# Patient Record
Sex: Female | Born: 1980 | Race: Asian | Hispanic: No | Marital: Married | Smoking: Never smoker
Health system: Southern US, Community
[De-identification: ages and names within clinical notes are randomized; demographics above are authoritative.]

## PROBLEM LIST (undated history)

## (undated) DIAGNOSIS — F329 Major depressive disorder, single episode, unspecified: Secondary | ICD-10-CM

## (undated) DIAGNOSIS — K219 Gastro-esophageal reflux disease without esophagitis: Secondary | ICD-10-CM

## (undated) DIAGNOSIS — F32A Depression, unspecified: Secondary | ICD-10-CM

## (undated) DIAGNOSIS — R51 Headache: Secondary | ICD-10-CM

---

## 2004-05-06 ENCOUNTER — Emergency Department (HOSPITAL_COMMUNITY): Admission: EM | Admit: 2004-05-06 | Discharge: 2004-05-06 | Payer: Self-pay | Admitting: Family Medicine

## 2004-12-24 ENCOUNTER — Emergency Department (HOSPITAL_COMMUNITY): Admission: EM | Admit: 2004-12-24 | Discharge: 2004-12-24 | Payer: Self-pay | Admitting: Emergency Medicine

## 2007-06-19 ENCOUNTER — Other Ambulatory Visit: Admission: RE | Admit: 2007-06-19 | Discharge: 2007-06-19 | Payer: Self-pay | Admitting: Obstetrics and Gynecology

## 2008-09-02 ENCOUNTER — Ambulatory Visit (HOSPITAL_COMMUNITY): Admission: RE | Admit: 2008-09-02 | Discharge: 2008-09-02 | Payer: Self-pay | Admitting: Family Medicine

## 2008-09-14 ENCOUNTER — Encounter (HOSPITAL_COMMUNITY): Admission: RE | Admit: 2008-09-14 | Discharge: 2008-10-14 | Payer: Self-pay | Admitting: Family Medicine

## 2008-10-06 ENCOUNTER — Encounter (INDEPENDENT_AMBULATORY_CARE_PROVIDER_SITE_OTHER): Payer: Self-pay | Admitting: *Deleted

## 2008-11-22 ENCOUNTER — Ambulatory Visit: Payer: Self-pay | Admitting: Gastroenterology

## 2008-11-22 DIAGNOSIS — R1012 Left upper quadrant pain: Secondary | ICD-10-CM

## 2008-11-22 DIAGNOSIS — R110 Nausea: Secondary | ICD-10-CM

## 2008-11-23 ENCOUNTER — Ambulatory Visit (HOSPITAL_COMMUNITY): Admission: RE | Admit: 2008-11-23 | Discharge: 2008-11-23 | Payer: Self-pay | Admitting: Gastroenterology

## 2008-11-23 ENCOUNTER — Encounter: Payer: Self-pay | Admitting: Gastroenterology

## 2008-11-23 ENCOUNTER — Ambulatory Visit: Payer: Self-pay | Admitting: Gastroenterology

## 2008-11-24 ENCOUNTER — Encounter: Payer: Self-pay | Admitting: Gastroenterology

## 2010-05-30 ENCOUNTER — Encounter: Payer: Self-pay | Admitting: Gastroenterology

## 2010-06-25 ENCOUNTER — Encounter: Payer: Self-pay | Admitting: Family Medicine

## 2010-07-05 NOTE — Letter (Signed)
Summary: REFERRAL FROM [facility] MEDICAL  REFERRAL FROM [facility] MEDICAL   Imported By: Rexene Alberts 05/30/2010 11:24:32  _____________________________________________________________________  External Attachment:    Type:   Image     Comment:   External Document

## 2010-09-10 LAB — PREGNANCY, URINE: Preg Test, Ur: NEGATIVE

## 2010-10-16 NOTE — Op Note (Signed)
NAMEKAMALEI, [name]              ACCOUNT NO.:  [id number]   MEDICAL RECORD NO.:  [id number]          PATIENT TYPE:  AMB   LOCATION:  DAY                           FACILITY:  [facility]   PHYSICIAN:  Kassie Mends, M.D.      DATE OF BIRTH:  [birth year]   DATE OF PROCEDURE:  11/23/2008  DATE OF DISCHARGE:                               OPERATIVE REPORT   PRIMARY Rhonda Riley:  Melony Overly, PA   PROCEDURE:  Esophagogastroduodenoscopy with cold forceps biopsy of the  gastric mucosa.   INDICATION FOR EXAM:  Ms. Rhonda Riley is a 30 year old female who presents  with nausea and left upper quadrant pain.  Her symptoms are usually  exacerbated by stress.  She states she has a lot of stress of work.  She  is currently on break from work and is having less upper symptoms.  Her  last menstrual period was on November 25, 2008.  She did have an urine hCG  today which was negative.  She denies any problems swallowing, vomiting,  or the use of aspirin, BC or Goody Powders, ibuprofen, Motrin, or Aleve.   FINDINGS:  1. Normal esophagus with slightly irregular Z-line.  Otherwise, no      evidence of Barrett's, mass, erosion, ulceration, or stricture.  2. Occasional erosion seen in the antrum with patchy erythema.  No      ulcers.  Biopsies obtained via cold forceps to evaluate for H.      pylori gastritis or eosinophilic gastritis.  3. Normal duodenal bulb and second portion of the duodenum.   DIAGNOSIS:  Left upper quadrant pain likely secondary to functional  abdominal pain.   RECOMMENDATIONS:  1. She should continue to see her psychiatrist and have her mental      health issues managed.  2. Follow up appointment in 2 months with Tana Coast regarding her      abdominal pain.  We will call her with the results of her biopsies.  3. No aspirin, NSAIDs, or anticoagulation for 7 days.   MEDICATIONS:  1. Demerol 50 mg IV.  2. Versed 3 mg IV.  3. Phenergan 12.5 mg IV.   PROCEDURE TECHNIQUE:  Physical exam was  performed.  Informed consent was  obtained from the patient after explaining the benefits, risks, and  alternatives to the procedure.  The patient was connected to the monitor  and placed in the left lateral position.  Continuous oxygen was provided  by nasal cannula, and IV medicine administered through an indwelling  cannula.  After administration of sedation, the patient's esophagus was  intubated, and the scope was advanced under direct visualization to the  second portion of the duodenum.  The scope was removed  slowly by carefully examining the color, texture, anatomy, and integrity  of the mucosa on the way out.  The patient was recovered in endoscopy  and discharged home in satisfactory condition.   PATH:  Chronic gastritis. Add omeprazole qd.      Kassie Mends, M.D.  Electronically Signed     SM/MEDQ  D:  11/23/2008  T:  11/24/2008  Job:  [id number]   cc:   Melony Overly, PA   Tilford Pillar, MD  Fax: [phone]

## 2011-01-18 IMAGING — US US ABDOMEN COMPLETE
1 series · 14 of 25 positions shown · non-contrast
Comparison: None

CLINICAL DATA: Abdominal pain with nausea.

COMPLETE ABDOMINAL ULTRASOUND

[Series 1: us abdomen complete · 0.32mm/px · 14 of 85 slices shown]
[im 1/85]
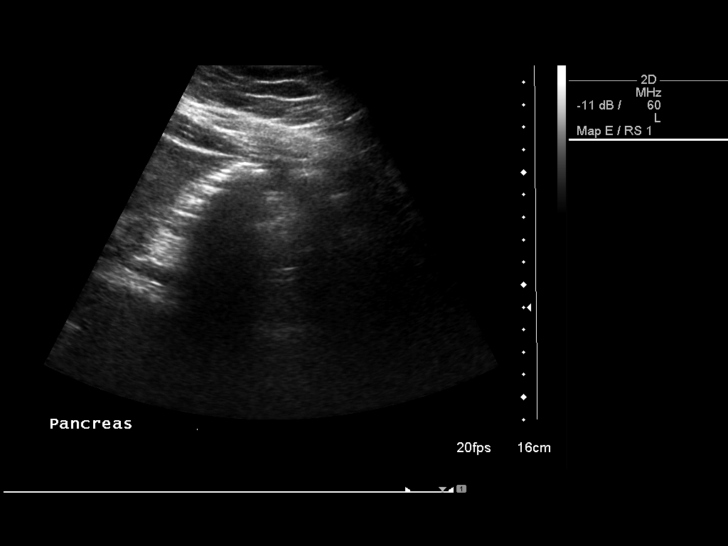
[im 8/85]
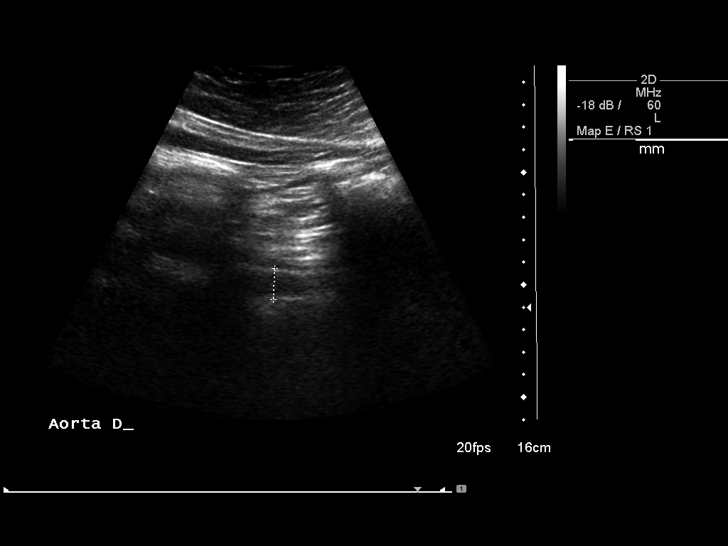
[im 15/85]
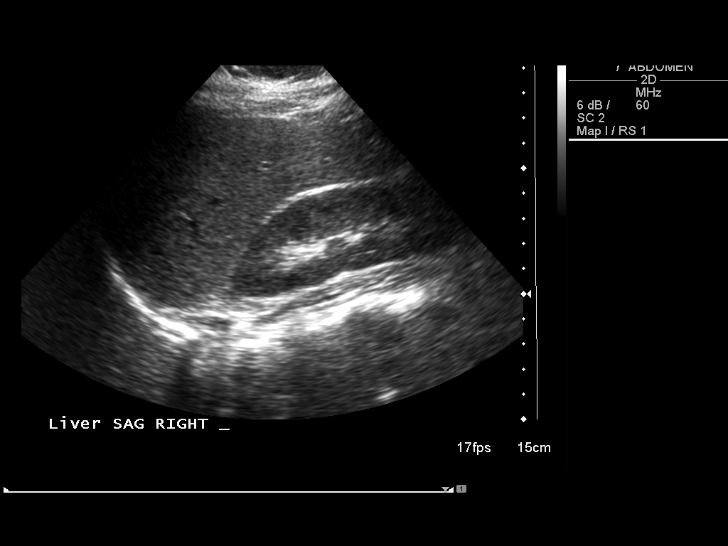
[im 22/85]
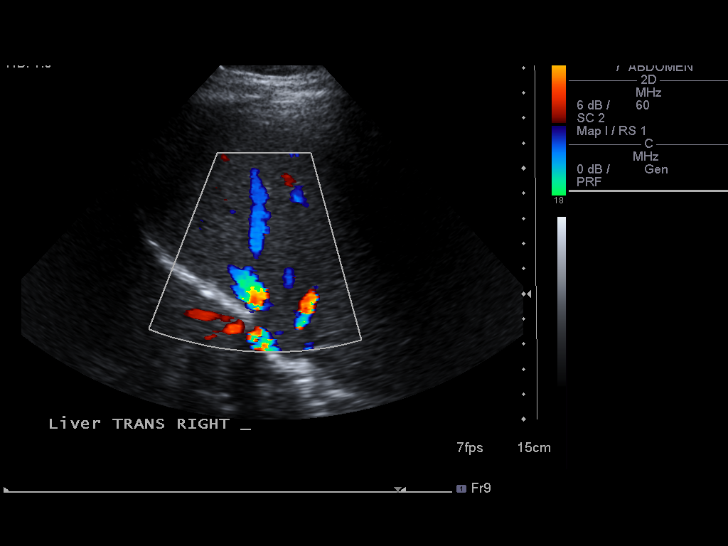
[im 29/85]
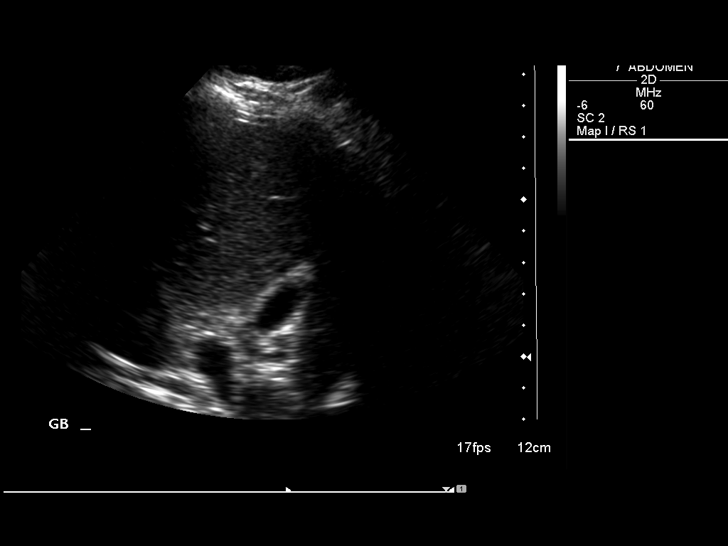
[im 32/85]
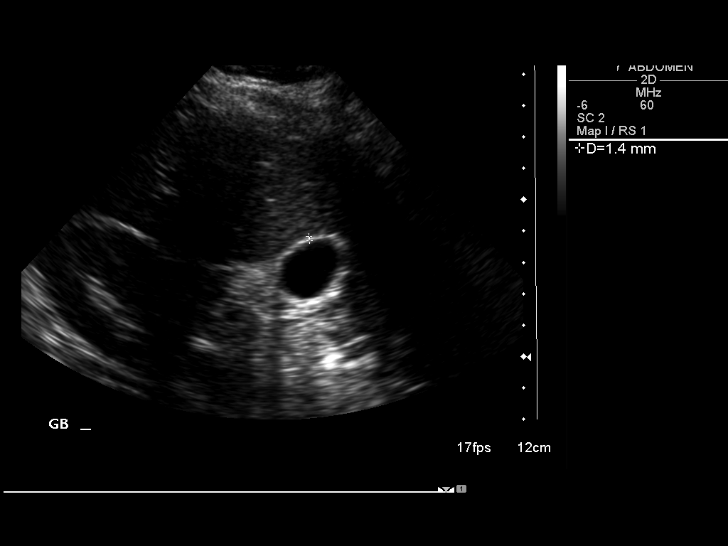
[im 39/85]
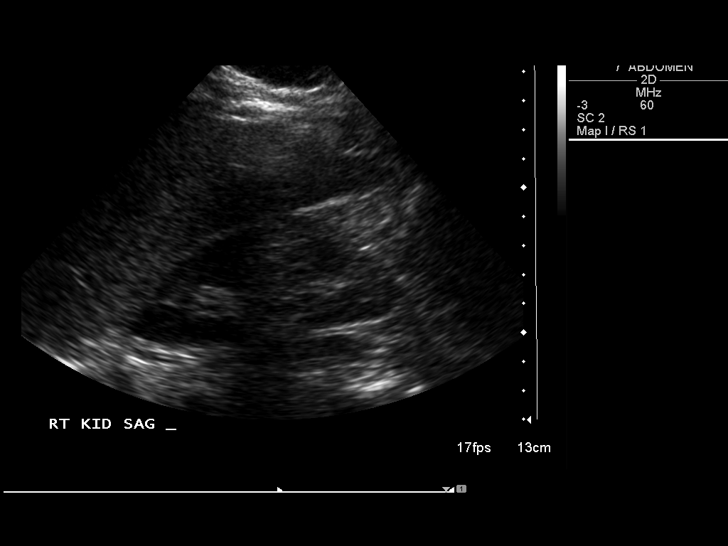
[im 46/85]
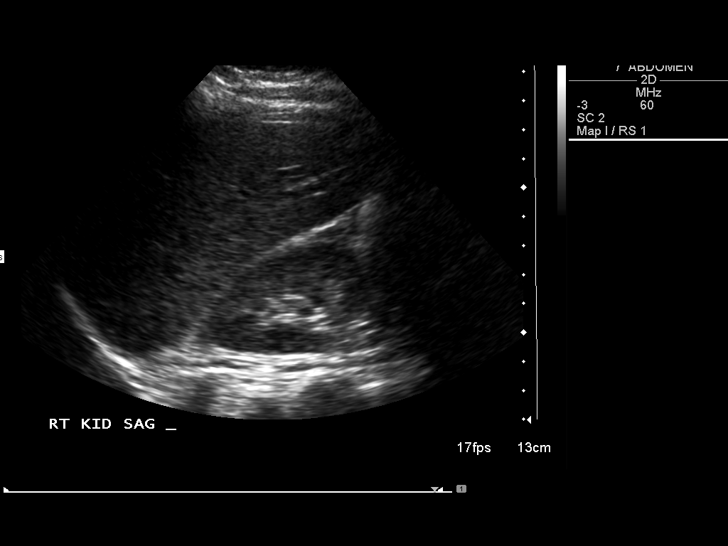
[im 53/85]
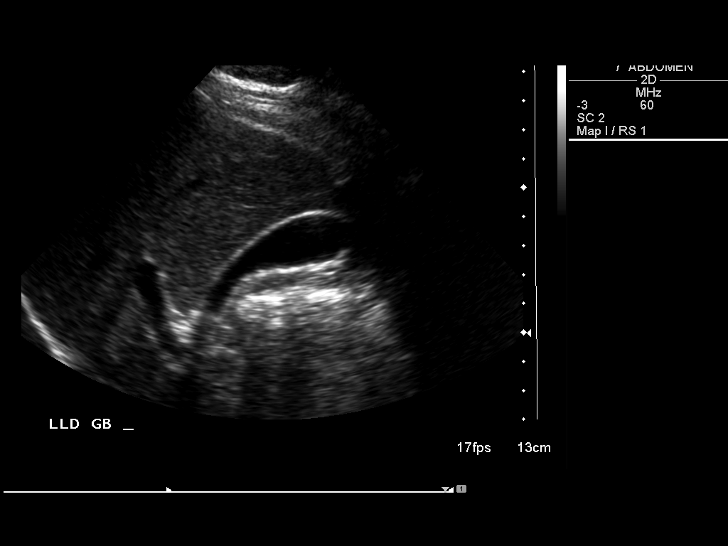
[im 57/85]
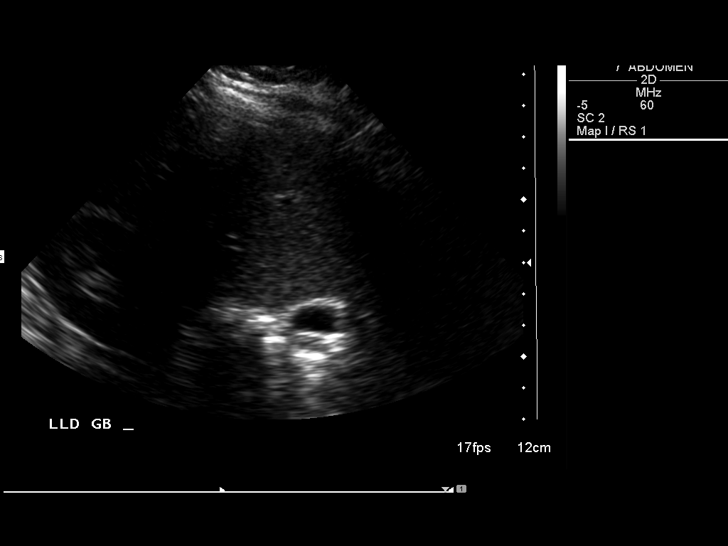
[im 64/85]
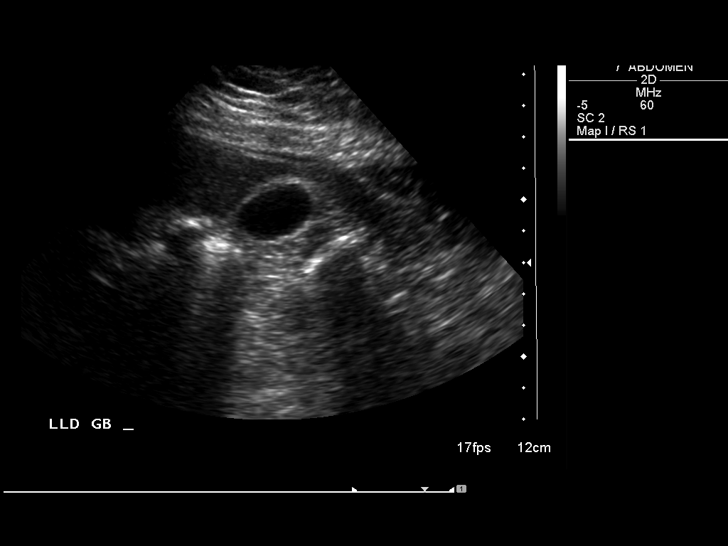
[im 71/85]
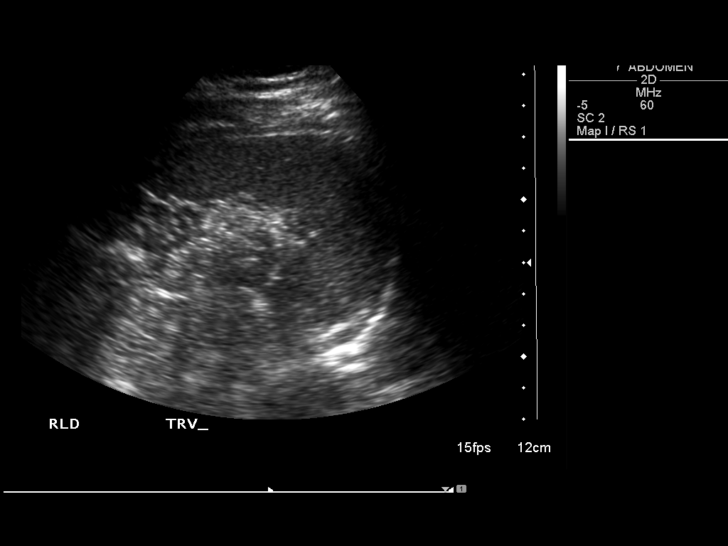
[im 78/85]
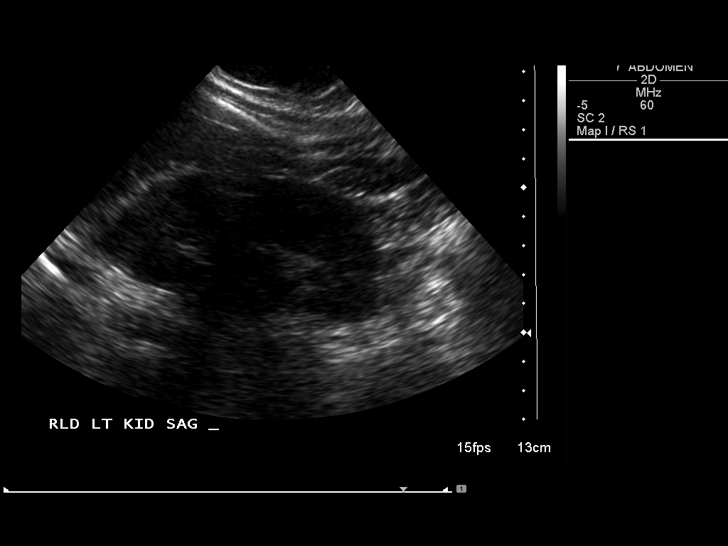
[im 85/85]
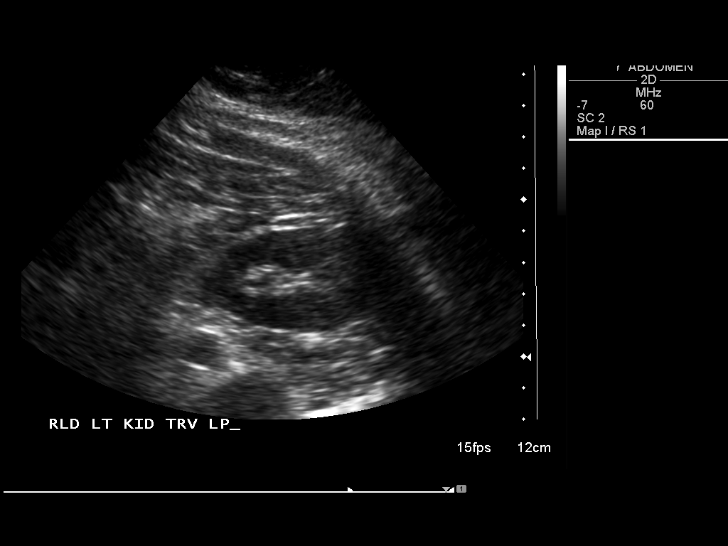

[14 of 25 positions shown; findings below may reference images not displayed]

FINDINGS: Gallbladder:  Normal - no evidence of cholelithiasis or
cholecystitis.

Common bile duct:  Normal - 2.5 mm.

Liver:  Normal

IVC:  Normal

Pancreas:  Not well visualized secondary to overlying bowel gas.

Spleen:  Normal

Right Kidney:  Normal

Left Kidney:  Normal

Abdominal aorta:  Unremarkable.

No evidence of free fluid identified.
IMPRESSION: Pancreas not well visualized otherwise unremarkable abdominal
ultrasound.

## 2011-10-23 LAB — OB RESULTS CONSOLE RPR: RPR: NONREACTIVE

## 2011-10-23 LAB — OB RESULTS CONSOLE ANTIBODY SCREEN: Antibody Screen: NEGATIVE

## 2011-10-23 LAB — OB RESULTS CONSOLE RUBELLA ANTIBODY, IGM: Rubella: UNDETERMINED

## 2011-10-23 LAB — OB RESULTS CONSOLE ABO/RH: RH Type: POSITIVE

## 2011-11-14 LAB — OB RESULTS CONSOLE GC/CHLAMYDIA: Chlamydia: NEGATIVE

## 2012-06-06 ENCOUNTER — Inpatient Hospital Stay (HOSPITAL_COMMUNITY)
Admission: AD | Admit: 2012-06-06 | Discharge: 2012-06-06 | Disposition: A | Discharge status: Home / Self Care | Payer: BC Managed Care – PPO | Source: Ambulatory Visit | Attending: Obstetrics and Gynecology | Admitting: Obstetrics and Gynecology

## 2012-06-06 ENCOUNTER — Encounter (HOSPITAL_COMMUNITY): Payer: Self-pay | Admitting: *Deleted

## 2012-06-06 DIAGNOSIS — O479 False labor, unspecified: Secondary | ICD-10-CM

## 2012-06-06 MED ORDER — ZOLPIDEM TARTRATE 5 MG PO TABS
5.0000 mg | ORAL_TABLET | Freq: Once | ORAL | Status: AC
  Administered 2012-06-06: 5 mg via ORAL
  Filled 2012-06-06: qty 1

## 2012-06-06 NOTE — MAU Note (Addendum)
Pt reports having ctxx q 5 min. Denies SROM reports some mucus and bloody show a nd reports good fetal movement.

## 2012-06-06 NOTE — ED Provider Notes (Signed)
This is a 32 y.o. female at [redacted]w[redacted]d G1P0 who presented for labor evaluation. She reports leaking fluid all day. No gross leakage, just small amounts.  FHR reactive UCs every 2-3 minutes  Speculum exam:  No pooling                               Small amount tan/pink mucous discharge  Cervix 1-2/85/-2/vtx/mid-posterior

## 2012-06-06 NOTE — MAU Note (Signed)
Notified Dr. Arelia Sneddon patient presented for r/o ROM and labor, negative fern slide per speculum exam, contractions every 3 to 4 minutes moderate to strong, cervix 1.5/80/-2 vertex with some bloody show, fhr reactive, patient to walk and be rechecked in one hour.

## 2012-06-06 NOTE — MAU Note (Signed)
Notified MD cervix unchanged from previous check, fhr reactive, received order to discharge home with Ambien prior to discharge.

## 2012-06-08 ENCOUNTER — Encounter (HOSPITAL_COMMUNITY): Payer: Self-pay | Admitting: *Deleted

## 2012-06-08 ENCOUNTER — Inpatient Hospital Stay (HOSPITAL_COMMUNITY)
Admission: AD | Admit: 2012-06-08 | Discharge: 2012-06-12 | DRG: 370 | Disposition: A | Discharge status: Home / Self Care | Payer: BC Managed Care – PPO | Source: Ambulatory Visit | Attending: Obstetrics and Gynecology | Admitting: Obstetrics and Gynecology

## 2012-06-08 DIAGNOSIS — O9903 Anemia complicating the puerperium: Secondary | ICD-10-CM | POA: Diagnosis not present

## 2012-06-08 DIAGNOSIS — R1012 Left upper quadrant pain: Secondary | ICD-10-CM

## 2012-06-08 DIAGNOSIS — O324XX0 Maternal care for high head at term, not applicable or unspecified: Secondary | ICD-10-CM | POA: Diagnosis present

## 2012-06-08 DIAGNOSIS — R110 Nausea: Secondary | ICD-10-CM

## 2012-06-08 DIAGNOSIS — D62 Acute posthemorrhagic anemia: Secondary | ICD-10-CM | POA: Diagnosis not present

## 2012-06-08 HISTORY — DX: Gastro-esophageal reflux disease without esophagitis: K21.9

## 2012-06-08 HISTORY — DX: Headache: R51

## 2012-06-08 HISTORY — DX: Major depressive disorder, single episode, unspecified: F32.9

## 2012-06-08 HISTORY — DX: Depression, unspecified: F32.A

## 2012-06-08 LAB — TYPE AND SCREEN: ABO/RH(D): O POS

## 2012-06-08 LAB — CBC
Platelets: 226 10*3/uL (ref 150–400)
RDW: 13.2 % (ref 11.5–15.5)
WBC: 10.1 10*3/uL (ref 4.0–10.5)

## 2012-06-08 LAB — ABO/RH: ABO/RH(D): O POS

## 2012-06-08 MED ORDER — BUTORPHANOL TARTRATE 1 MG/ML IJ SOLN
1.0000 mg | INTRAMUSCULAR | Status: DC | PRN
  Administered 2012-06-08: 1 mg via INTRAVENOUS
  Filled 2012-06-08: qty 1

## 2012-06-08 MED ORDER — TERBUTALINE SULFATE 1 MG/ML IJ SOLN: 0.2500 mg | Freq: Once | INTRAMUSCULAR | Status: AC | PRN

## 2012-06-08 MED ORDER — EPHEDRINE 5 MG/ML INJ: 10.0000 mg | INTRAVENOUS | Status: DC | PRN

## 2012-06-08 MED ORDER — ACETAMINOPHEN 325 MG PO TABS: 650.0000 mg | ORAL_TABLET | ORAL | Status: DC | PRN

## 2012-06-08 MED ORDER — OXYCODONE-ACETAMINOPHEN 5-325 MG PO TABS: 1.0000 | ORAL_TABLET | ORAL | Status: DC | PRN

## 2012-06-08 MED ORDER — OXYTOCIN 40 UNITS IN LACTATED RINGERS INFUSION - SIMPLE MED: 62.5000 mL/h | INTRAVENOUS | Status: DC

## 2012-06-08 MED ORDER — PHENYLEPHRINE 40 MCG/ML (10ML) SYRINGE FOR IV PUSH (FOR BLOOD PRESSURE SUPPORT)
80.0000 ug | PREFILLED_SYRINGE | INTRAVENOUS | Status: DC | PRN
  Filled 2012-06-08: qty 5

## 2012-06-08 MED ORDER — DIPHENHYDRAMINE HCL 50 MG/ML IJ SOLN: 12.5000 mg | INTRAMUSCULAR | Status: DC | PRN

## 2012-06-08 MED ORDER — LACTATED RINGERS IV SOLN
500.0000 mL | INTRAVENOUS | Status: DC | PRN
  Administered 2012-06-08: 300 mL via INTRAVENOUS

## 2012-06-08 MED ORDER — EPHEDRINE 5 MG/ML INJ
10.0000 mg | INTRAVENOUS | Status: DC | PRN
  Filled 2012-06-08: qty 4

## 2012-06-08 MED ORDER — LIDOCAINE HCL (PF) 1 % IJ SOLN: 30.0000 mL | INTRAMUSCULAR | Status: DC | PRN

## 2012-06-08 MED ORDER — LACTATED RINGERS IV SOLN: 500.0000 mL | Freq: Once | INTRAVENOUS | Status: DC

## 2012-06-08 MED ORDER — PHENYLEPHRINE 40 MCG/ML (10ML) SYRINGE FOR IV PUSH (FOR BLOOD PRESSURE SUPPORT): 80.0000 ug | PREFILLED_SYRINGE | INTRAVENOUS | Status: DC | PRN

## 2012-06-08 MED ORDER — LACTATED RINGERS IV SOLN
INTRAVENOUS | Status: DC
  Administered 2012-06-08 (×2): via INTRAVENOUS

## 2012-06-08 MED ORDER — OXYTOCIN 40 UNITS IN LACTATED RINGERS INFUSION - SIMPLE MED
1.0000 m[IU]/min | INTRAVENOUS | Status: DC
  Administered 2012-06-09: 1 m[IU]/min via INTRAVENOUS
  Filled 2012-06-08: qty 1000

## 2012-06-08 MED ORDER — OXYTOCIN BOLUS FROM INFUSION: 500.0000 mL | INTRAVENOUS | Status: DC

## 2012-06-08 MED ORDER — CITRIC ACID-SODIUM CITRATE 334-500 MG/5ML PO SOLN
30.0000 mL | ORAL | Status: DC | PRN
  Administered 2012-06-09: 30 mL via ORAL
  Filled 2012-06-08: qty 15

## 2012-06-08 MED ORDER — FLEET ENEMA 7-19 GM/118ML RE ENEM
1.0000 | ENEMA | RECTAL | Status: DC | PRN
  Administered 2012-06-08: 1 via RECTAL

## 2012-06-08 MED ORDER — FENTANYL 2.5 MCG/ML BUPIVACAINE 1/10 % EPIDURAL INFUSION (WH - ANES)
14.0000 mL/h | INTRAMUSCULAR | Status: DC
  Filled 2012-06-08: qty 125

## 2012-06-08 MED ORDER — ONDANSETRON HCL 4 MG/2ML IJ SOLN: 4.0000 mg | Freq: Four times a day (QID) | INTRAMUSCULAR | Status: DC | PRN

## 2012-06-08 MED ORDER — IBUPROFEN 600 MG PO TABS: 600.0000 mg | ORAL_TABLET | Freq: Four times a day (QID) | ORAL | Status: DC | PRN

## 2012-06-08 NOTE — H&P (Signed)
Rhonda Riley is a 32 y.o. female presenting for C/O UCs. No ROM, no HA/vision changes, no epigastric pain. Maternal Medical History:  Reason for admission: Reason for admission: contractions.  Contractions: Onset was 2 days ago.   Frequency: regular.   Perceived severity is moderate.    Fetal activity: Perceived fetal activity is normal.      OB History    Grav Para Term Preterm Abortions TAB SAB Ect Mult Living   1 0 0 0 0 0 0 0 0 0      Past Medical History  Diagnosis Date  . Depression   . GERD (gastroesophageal reflux disease)   . Headache    History reviewed. No pertinent past surgical history. Family History: family history includes Diabetes in her maternal grandmother and Miscarriages / [place] in her mother. Social History:  reports that she has never smoked. She does not have any smokeless tobacco history on file. She reports that she does not drink alcohol or use illicit drugs.   Prenatal Transfer Tool  Maternal Diabetes: No Genetic Screening: Normal Maternal Ultrasounds/Referrals: Normal Fetal Ultrasounds or other Referrals:  None Maternal Substance Abuse:  No Significant Maternal Medications:  None Significant Maternal Lab Results:  None Other Comments:  None  Review of Systems  Eyes: Negative for blurred vision.  Gastrointestinal: Negative for abdominal pain.  Neurological: Negative for headaches.      Blood pressure 110/71, pulse 70, temperature 98 F (36.7 C), temperature source Oral, resp. rate 20, height 4\' 9"  (1.448 m), weight 138 lb 9.6 oz (62.869 kg). Maternal Exam:  Abdomen: Fetal presentation: vertex     Fetal Exam Fetal Monitor Review: Pattern: accelerations present.       Physical Exam  Cardiovascular: Normal rate and regular rhythm.   Respiratory: Effort normal and breath sounds normal.  GI: There is no tenderness.  Neurological: She has normal reflexes.   Cx in office  4/C/0/vtx Prenatal labs: ABO, Rh: O/Positive/--  (05/22 0000) Antibody: Negative (05/22 0000) Rubella: Equivocal (05/22 0000) RPR: Nonreactive (05/22 0000)  HBsAg:    HIV: Non-reactive (05/22 0000)  GBS: Negative (12/11 0000)   Assessment/Plan: 32 yo G1P0 at term entering active labor Admit Options for pain relief reviewed   Orvill Coulthard II,Shivonne Schwartzman E 06/08/2012, 12:56 PM

## 2012-06-08 NOTE — Progress Notes (Signed)
Cx 4-5/C/0 AROM light meconium FHT reactive UCs q2-4 min

## 2012-06-09 ENCOUNTER — Encounter (HOSPITAL_COMMUNITY)
Admission: AD | Disposition: A | Discharge status: Home / Self Care | Payer: Self-pay | Source: Ambulatory Visit | Attending: Obstetrics and Gynecology

## 2012-06-09 ENCOUNTER — Encounter (HOSPITAL_COMMUNITY): Payer: Self-pay | Admitting: Anesthesiology

## 2012-06-09 ENCOUNTER — Encounter (HOSPITAL_COMMUNITY): Payer: Self-pay | Admitting: *Deleted

## 2012-06-09 ENCOUNTER — Observation Stay (HOSPITAL_COMMUNITY): Payer: BC Managed Care – PPO | Admitting: Anesthesiology

## 2012-06-09 SURGERY — Surgical Case
Anesthesia: Epidural | Site: Abdomen | Wound class: Clean Contaminated

## 2012-06-09 MED ORDER — CEFAZOLIN SODIUM-DEXTROSE 2-3 GM-% IV SOLR
INTRAVENOUS | Status: AC
  Filled 2012-06-09: qty 50

## 2012-06-09 MED ORDER — MORPHINE SULFATE 0.5 MG/ML IJ SOLN
INTRAMUSCULAR | Status: AC
  Filled 2012-06-09: qty 10

## 2012-06-09 MED ORDER — SODIUM BICARBONATE 8.4 % IV SOLN
INTRAVENOUS | Status: DC | PRN
  Administered 2012-06-09: 5 mL via EPIDURAL

## 2012-06-09 MED ORDER — MEPERIDINE HCL 25 MG/ML IJ SOLN
INTRAMUSCULAR | Status: DC | PRN
  Administered 2012-06-09 (×2): 12.5 mg via INTRAVENOUS

## 2012-06-09 MED ORDER — FENTANYL 2.5 MCG/ML BUPIVACAINE 1/10 % EPIDURAL INFUSION (WH - ANES)
INTRAMUSCULAR | Status: DC | PRN
  Administered 2012-06-09: 14 mL/h via EPIDURAL

## 2012-06-09 MED ORDER — SODIUM CHLORIDE 0.9 % IJ SOLN: 3.0000 mL | INTRAMUSCULAR | Status: DC | PRN

## 2012-06-09 MED ORDER — LACTATED RINGERS IV SOLN
INTRAVENOUS | Status: DC | PRN
  Administered 2012-06-09: 06:00:00 via INTRAVENOUS

## 2012-06-09 MED ORDER — ONDANSETRON HCL 4 MG/2ML IJ SOLN
INTRAMUSCULAR | Status: DC | PRN
  Administered 2012-06-09: 4 mg via INTRAVENOUS

## 2012-06-09 MED ORDER — SCOPOLAMINE 1 MG/3DAYS TD PT72
MEDICATED_PATCH | TRANSDERMAL | Status: AC
  Filled 2012-06-09: qty 1

## 2012-06-09 MED ORDER — ZOLPIDEM TARTRATE 5 MG PO TABS: 5.0000 mg | ORAL_TABLET | Freq: Every evening | ORAL | Status: DC | PRN

## 2012-06-09 MED ORDER — SIMETHICONE 80 MG PO CHEW
80.0000 mg | CHEWABLE_TABLET | Freq: Three times a day (TID) | ORAL | Status: DC
  Administered 2012-06-09 – 2012-06-12 (×8): 80 mg via ORAL

## 2012-06-09 MED ORDER — ONDANSETRON HCL 4 MG/2ML IJ SOLN
4.0000 mg | INTRAMUSCULAR | Status: DC | PRN
  Administered 2012-06-09: 4 mg via INTRAVENOUS

## 2012-06-09 MED ORDER — LACTATED RINGERS IV SOLN
INTRAVENOUS | Status: DC
  Administered 2012-06-09: 11:00:00 via INTRAVENOUS

## 2012-06-09 MED ORDER — HYDROMORPHONE HCL PF 1 MG/ML IJ SOLN
INTRAMUSCULAR | Status: AC
  Administered 2012-06-09: 0.5 mg via INTRAVENOUS
  Filled 2012-06-09: qty 1

## 2012-06-09 MED ORDER — LACTATED RINGERS IV SOLN
INTRAVENOUS | Status: DC | PRN
  Administered 2012-06-09 (×2): via INTRAVENOUS

## 2012-06-09 MED ORDER — PROMETHAZINE HCL 25 MG/ML IJ SOLN: 6.2500 mg | INTRAMUSCULAR | Status: DC | PRN

## 2012-06-09 MED ORDER — MEPERIDINE HCL 25 MG/ML IJ SOLN
INTRAMUSCULAR | Status: AC
  Filled 2012-06-09: qty 1

## 2012-06-09 MED ORDER — SCOPOLAMINE 1 MG/3DAYS TD PT72
1.0000 | MEDICATED_PATCH | Freq: Once | TRANSDERMAL | Status: AC
  Administered 2012-06-09: 1.5 mg via TRANSDERMAL

## 2012-06-09 MED ORDER — PHENYLEPHRINE 40 MCG/ML (10ML) SYRINGE FOR IV PUSH (FOR BLOOD PRESSURE SUPPORT)
PREFILLED_SYRINGE | INTRAVENOUS | Status: AC
  Filled 2012-06-09: qty 10

## 2012-06-09 MED ORDER — DIBUCAINE 1 % RE OINT: 1.0000 "application " | TOPICAL_OINTMENT | RECTAL | Status: DC | PRN

## 2012-06-09 MED ORDER — KETOROLAC TROMETHAMINE 30 MG/ML IJ SOLN: 15.0000 mg | Freq: Once | INTRAMUSCULAR | Status: DC | PRN

## 2012-06-09 MED ORDER — LACTATED RINGERS IV BOLUS (SEPSIS): 1000.0000 mL | Freq: Once | INTRAVENOUS | Status: DC

## 2012-06-09 MED ORDER — ONDANSETRON HCL 4 MG/2ML IJ SOLN
INTRAMUSCULAR | Status: AC
  Filled 2012-06-09: qty 2

## 2012-06-09 MED ORDER — MEPERIDINE HCL 25 MG/ML IJ SOLN: 6.2500 mg | INTRAMUSCULAR | Status: DC | PRN

## 2012-06-09 MED ORDER — CEFAZOLIN SODIUM-DEXTROSE 2-3 GM-% IV SOLR
INTRAVENOUS | Status: DC | PRN
  Administered 2012-06-09: 2 g via INTRAVENOUS

## 2012-06-09 MED ORDER — KETOROLAC TROMETHAMINE 60 MG/2ML IM SOLN
INTRAMUSCULAR | Status: AC
  Filled 2012-06-09: qty 2

## 2012-06-09 MED ORDER — DIPHENHYDRAMINE HCL 50 MG/ML IJ SOLN: 25.0000 mg | INTRAMUSCULAR | Status: DC | PRN

## 2012-06-09 MED ORDER — LANOLIN HYDROUS EX OINT: 1.0000 "application " | TOPICAL_OINTMENT | CUTANEOUS | Status: DC | PRN

## 2012-06-09 MED ORDER — SENNOSIDES-DOCUSATE SODIUM 8.6-50 MG PO TABS
2.0000 | ORAL_TABLET | Freq: Every day | ORAL | Status: DC
  Administered 2012-06-10: 2 via ORAL

## 2012-06-09 MED ORDER — DIPHENHYDRAMINE HCL 25 MG PO CAPS: 25.0000 mg | ORAL_CAPSULE | ORAL | Status: DC | PRN

## 2012-06-09 MED ORDER — KETOROLAC TROMETHAMINE 30 MG/ML IJ SOLN: 30.0000 mg | Freq: Four times a day (QID) | INTRAMUSCULAR | Status: AC | PRN

## 2012-06-09 MED ORDER — NALOXONE HCL 0.4 MG/ML IJ SOLN: 0.4000 mg | INTRAMUSCULAR | Status: DC | PRN

## 2012-06-09 MED ORDER — OXYCODONE-ACETAMINOPHEN 5-325 MG PO TABS
1.0000 | ORAL_TABLET | ORAL | Status: DC | PRN
  Administered 2012-06-10 – 2012-06-11 (×8): 1 via ORAL
  Administered 2012-06-12 (×2): 2 via ORAL
  Filled 2012-06-09 (×2): qty 1
  Filled 2012-06-09: qty 2
  Filled 2012-06-09: qty 1
  Filled 2012-06-09: qty 2
  Filled 2012-06-09 (×5): qty 1

## 2012-06-09 MED ORDER — 0.9 % SODIUM CHLORIDE (POUR BTL) OPTIME
TOPICAL | Status: DC | PRN
  Administered 2012-06-09: 200 mL

## 2012-06-09 MED ORDER — IBUPROFEN 600 MG PO TABS
600.0000 mg | ORAL_TABLET | Freq: Four times a day (QID) | ORAL | Status: DC
  Administered 2012-06-09 – 2012-06-12 (×12): 600 mg via ORAL
  Filled 2012-06-09 (×12): qty 1

## 2012-06-09 MED ORDER — OXYTOCIN 40 UNITS IN LACTATED RINGERS INFUSION - SIMPLE MED: 62.5000 mL/h | INTRAVENOUS | Status: AC

## 2012-06-09 MED ORDER — SIMETHICONE 80 MG PO CHEW: 80.0000 mg | CHEWABLE_TABLET | ORAL | Status: DC | PRN

## 2012-06-09 MED ORDER — ONDANSETRON HCL 4 MG/2ML IJ SOLN
4.0000 mg | Freq: Three times a day (TID) | INTRAMUSCULAR | Status: DC | PRN
  Filled 2012-06-09: qty 2

## 2012-06-09 MED ORDER — TETANUS-DIPHTH-ACELL PERTUSSIS 5-2.5-18.5 LF-MCG/0.5 IM SUSP: 0.5000 mL | Freq: Once | INTRAMUSCULAR | Status: DC

## 2012-06-09 MED ORDER — PHENYLEPHRINE 40 MCG/ML (10ML) SYRINGE FOR IV PUSH (FOR BLOOD PRESSURE SUPPORT)
PREFILLED_SYRINGE | INTRAVENOUS | Status: AC
  Filled 2012-06-09: qty 5

## 2012-06-09 MED ORDER — HYDROMORPHONE HCL PF 1 MG/ML IJ SOLN
0.2500 mg | INTRAMUSCULAR | Status: DC | PRN
  Administered 2012-06-09: 0.5 mg via INTRAVENOUS

## 2012-06-09 MED ORDER — OXYTOCIN 10 UNIT/ML IJ SOLN
40.0000 [IU] | INTRAVENOUS | Status: DC | PRN
  Administered 2012-06-09: 40 [IU] via INTRAVENOUS

## 2012-06-09 MED ORDER — KETOROLAC TROMETHAMINE 30 MG/ML IJ SOLN
30.0000 mg | Freq: Four times a day (QID) | INTRAMUSCULAR | Status: AC | PRN
  Administered 2012-06-09: 30 mg via INTRAVENOUS
  Filled 2012-06-09: qty 1

## 2012-06-09 MED ORDER — NALBUPHINE HCL 10 MG/ML IJ SOLN
5.0000 mg | INTRAMUSCULAR | Status: DC | PRN
  Filled 2012-06-09: qty 1

## 2012-06-09 MED ORDER — WITCH HAZEL-GLYCERIN EX PADS: 1.0000 "application " | MEDICATED_PAD | CUTANEOUS | Status: DC | PRN

## 2012-06-09 MED ORDER — NALOXONE HCL 1 MG/ML IJ SOLN
1.0000 ug/kg/h | INTRAVENOUS | Status: DC | PRN
  Filled 2012-06-09: qty 2

## 2012-06-09 MED ORDER — KETOROLAC TROMETHAMINE 60 MG/2ML IM SOLN
60.0000 mg | Freq: Once | INTRAMUSCULAR | Status: AC | PRN
  Administered 2012-06-09: 60 mg via INTRAMUSCULAR

## 2012-06-09 MED ORDER — LIDOCAINE HCL (PF) 1 % IJ SOLN
INTRAMUSCULAR | Status: DC | PRN
  Administered 2012-06-09 (×2): 8 mL

## 2012-06-09 MED ORDER — OXYTOCIN 10 UNIT/ML IJ SOLN
INTRAMUSCULAR | Status: AC
  Filled 2012-06-09: qty 4

## 2012-06-09 MED ORDER — PRENATAL MULTIVITAMIN CH
1.0000 | ORAL_TABLET | Freq: Every day | ORAL | Status: DC
  Administered 2012-06-10 – 2012-06-12 (×3): 1 via ORAL
  Filled 2012-06-09 (×3): qty 1

## 2012-06-09 MED ORDER — ONDANSETRON HCL 4 MG PO TABS
4.0000 mg | ORAL_TABLET | ORAL | Status: DC | PRN
  Administered 2012-06-12: 4 mg via ORAL
  Filled 2012-06-09: qty 1

## 2012-06-09 MED ORDER — MENTHOL 3 MG MT LOZG: 1.0000 | LOZENGE | OROMUCOSAL | Status: DC | PRN

## 2012-06-09 MED ORDER — MORPHINE SULFATE (PF) 0.5 MG/ML IJ SOLN
INTRAMUSCULAR | Status: DC | PRN
  Administered 2012-06-09: 3000 ug via EPIDURAL

## 2012-06-09 MED ORDER — METOCLOPRAMIDE HCL 5 MG/ML IJ SOLN: 10.0000 mg | Freq: Three times a day (TID) | INTRAMUSCULAR | Status: DC | PRN

## 2012-06-09 MED ORDER — PHENYLEPHRINE HCL 10 MG/ML IJ SOLN
INTRAMUSCULAR | Status: DC | PRN
  Administered 2012-06-09: 40 ug via INTRAVENOUS
  Administered 2012-06-09 (×3): 80 ug via INTRAVENOUS

## 2012-06-09 MED ORDER — DIPHENHYDRAMINE HCL 25 MG PO CAPS: 25.0000 mg | ORAL_CAPSULE | Freq: Four times a day (QID) | ORAL | Status: DC | PRN

## 2012-06-09 MED ORDER — DIPHENHYDRAMINE HCL 50 MG/ML IJ SOLN
12.5000 mg | INTRAMUSCULAR | Status: DC | PRN
  Administered 2012-06-09: 12.5 mg via INTRAVENOUS
  Filled 2012-06-09: qty 1

## 2012-06-09 SURGICAL SUPPLY — 35 items
BENZOIN TINCTURE PRP APPL 2/3 (GAUZE/BANDAGES/DRESSINGS) ×4 IMPLANT
CLOTH BEACON ORANGE TIMEOUT ST (SAFETY) ×2 IMPLANT
CONTAINER PREFILL 10% NBF 15ML (MISCELLANEOUS) IMPLANT
DERMABOND ADVANCED (GAUZE/BANDAGES/DRESSINGS)
DERMABOND ADVANCED .7 DNX12 (GAUZE/BANDAGES/DRESSINGS) IMPLANT
DRAPE LG THREE QUARTER DISP (DRAPES) ×2 IMPLANT
DRESSING TELFA 8X3 (GAUZE/BANDAGES/DRESSINGS) IMPLANT
DRSG OPSITE POSTOP 4X10 (GAUZE/BANDAGES/DRESSINGS) ×4 IMPLANT
DURAPREP 26ML APPLICATOR (WOUND CARE) ×2 IMPLANT
ELECT REM PT RETURN 9FT ADLT (ELECTROSURGICAL) ×2
ELECTRODE REM PT RTRN 9FT ADLT (ELECTROSURGICAL) ×1 IMPLANT
EXTRACTOR VACUUM M CUP 4 TUBE (SUCTIONS) IMPLANT
GAUZE SPONGE 4X4 12PLY STRL LF (GAUZE/BANDAGES/DRESSINGS) IMPLANT
GLOVE BIO SURGEON STRL SZ8 (GLOVE) ×4 IMPLANT
GOWN PREVENTION PLUS LG XLONG (DISPOSABLE) ×2 IMPLANT
KIT ABG SYR 3ML LUER SLIP (SYRINGE) ×2 IMPLANT
NEEDLE HYPO 25X5/8 SAFETYGLIDE (NEEDLE) ×2 IMPLANT
NS IRRIG 1000ML POUR BTL (IV SOLUTION) ×2 IMPLANT
PACK C SECTION WH (CUSTOM PROCEDURE TRAY) ×2 IMPLANT
PAD ABD 7.5X8 STRL (GAUZE/BANDAGES/DRESSINGS) ×2 IMPLANT
PAD OB MATERNITY 4.3X12.25 (PERSONAL CARE ITEMS) ×2 IMPLANT
SLEEVE SCD COMPRESS KNEE MED (MISCELLANEOUS) ×2 IMPLANT
STAPLER VISISTAT 35W (STAPLE) IMPLANT
STRIP CLOSURE SKIN 1/2X4 (GAUZE/BANDAGES/DRESSINGS) ×4 IMPLANT
SUT MNCRL 0 VIOLET CTX 36 (SUTURE) ×5 IMPLANT
SUT MONOCRYL 0 CTX 36 (SUTURE) ×5
SUT PDS AB 0 CTX 60 (SUTURE) ×2 IMPLANT
SUT PLAIN 0 NONE (SUTURE) IMPLANT
SUT PLAIN 2 0 (SUTURE) ×1
SUT PLAIN ABS 2-0 CT1 27XMFL (SUTURE) ×1 IMPLANT
SUT VIC AB 4-0 KS 27 (SUTURE) ×2 IMPLANT
TAPE CLOTH SURG 4X10 WHT LF (GAUZE/BANDAGES/DRESSINGS) ×2 IMPLANT
TOWEL OR 17X24 6PK STRL BLUE (TOWEL DISPOSABLE) ×6 IMPLANT
TRAY FOLEY CATH 14FR (SET/KITS/TRAYS/PACK) ×2 IMPLANT
WATER STERILE IRR 1000ML POUR (IV SOLUTION) ×2 IMPLANT

## 2012-06-09 NOTE — Op Note (Signed)
Rhonda Riley, Rhonda Riley              ACCOUNT NO.:  [id number]  MEDICAL RECORD NO.:  [id number]  LOCATION:                                 FACILITY:  PHYSICIAN:  Guy Sandifer. Henderson Cloud, M.D. DATE OF BIRTH:  11-12-1980  DATE OF PROCEDURE:  06/09/2012 DATE OF DISCHARGE:                              OPERATIVE REPORT   PREOPERATIVE DIAGNOSIS:  Arrest of descent.  POSTOPERATIVE DIAGNOSIS:  Arrest of descent.  PROCEDURE:  Primary low transverse cesarean section.  SURGEON:  Guy Sandifer. Henderson Cloud, M.D.  ANESTHESIA:  Epidural, Leilani Able, M.D.  ESTIMATED BLOOD LOSS:  750 mL.  FINDINGS:  Viable female infant, Apgars of 9 and 9 at 1 and 5 minutes respectively.  Birth weight and arterial cord pH pending.  SPECIMENS:  Placenta to pathology.  INDICATIONS AND CONSENT:  This patient is a 32 year old, G1, P0, at 67 and 3/7th weeks, who was admitted in active labor.  Artificial rupture of membranes was carried out for light meconium fluid.  She had slow, but steady progress to 8 cm.  At that point, contractions were every 2-6 minutes with no change observed after an hour and a half.  She was started on low-dose Pitocin augmentation.  She dilated to 10 cm.  She then began pushing with good effort, and after an hour and a half, there was no descent at all.  Baby remained at +1 station with caput. Diagnosis of arrest of dilation was made and recommendation for cesarean section was made.  Potential risks and complications were discussed with the patient and her husband preoperatively including not limited to, infection, organ damage, bleeding requiring transfusion of blood products with HIV and hepatitis acquisition, DVT, PE, pneumonia.  All questions were answered and consent was signed on the chart.  PROCEDURE:  The patient is taken to the operating room, where her epidural anesthetic was augmented to a surgical level.  She is in a dorsal supine position with a 15 degree left lateral wedge.  She  is prepped.  Foley catheter was placed in the bladder and she is draped in the [facility] protocol manner.  Time-out is undertaken.  After testing for adequate epidural anesthesia, skin was entered through a Pfannenstiel incision and dissection is carried out in layers of the peritoneum.  The peritoneum was sharply entered and extended superiorly and inferiorly.  Vesicouterine peritoneum was taken down cephalolaterally.  The bladder flap was developed.  The bladder blade was placed.  Uterus was incised in a low transverse manner.  The uterine cavity was entered bluntly with a hemostat.  The uterine incision is extended cephalolaterally with fingers.  Vertex is delivered without difficulty.  Baby is delivered without trouble.  Good cry and tone was noted.  Cord was clamped and cut.  The baby is handed to the waiting pediatricians.  Placenta is manually delivered and sent to pathology. Uterus was exteriorized.  The cavity was clean.  Uterus was closed in 2 running locking imbricating layers of Monocryl suture.  Two additional figure-of-eights were used to obtain complete hemostasis.  Tubes and ovaries were normal bilaterally.  Uterus was returned.  Irrigation is carried out.  Inspection reveals good hemostasis.  Anterior peritoneum  was closed in a running fashion with 0 Monocryl suture which was also used to reapproximate the pyramidalis muscle in the midline.  Anterior rectus fascia was closed in running fashion with a 0 looped PDS suture. Subcutaneous tissues were reapproximated with interrupted plain suture and the skin was closed with a subcuticular Vicryl on a Keith needle. Dressings were applied.  All counts correct.  The patient is transferred to recovery room in stable condition.     Guy Sandifer Henderson Cloud, M.D.     JET/MEDQ  D:  06/09/2012  T:  06/09/2012  Job:  [id number]

## 2012-06-09 NOTE — Brief Op Note (Signed)
06/08/2012 - 06/09/2012  6:08 AM  PATIENT:  Rhonda Riley  32 y.o. female  PRE-OPERATIVE DIAGNOSIS:  arrest of descent  POST-OPERATIVE DIAGNOSIS:  arrest of descent  PROCEDURE:  Procedure(s) (LRB) with comments: CESAREAN SECTION (N/A) - Primary cesarean section of baby boy  at 71  APGAR 8/9  SURGEON:  Surgeon(s) and Role:    * Leslie Andrea, MD - Primary  PHYSICIAN ASSISTANT:   ASSISTANTS: none   ANESTHESIA:   epidural  EBL:  Total I/O In: 1000 [I.V.:1000] Out: 1225 [Urine:525; Blood:700]  BLOOD ADMINISTERED:none  DRAINS: Urinary Catheter (Foley)   LOCAL MEDICATIONS USED:  NONE  SPECIMEN:  Source of Specimen:  placenta  DISPOSITION OF SPECIMEN:  PATHOLOGY  COUNTS:  YES  TOURNIQUET:  * No tourniquets in log *  DICTATION: .Other Dictation: Dictation Number [id number]  PLAN OF CARE: Admit to inpatient   PATIENT DISPOSITION:  PACU - hemodynamically stable.   Delay start of Pharmacological VTE agent (>24hrs) due to surgical blood loss or risk of bleeding: not applicable

## 2012-06-09 NOTE — Transfer of Care (Signed)
Immediate Anesthesia Transfer of Care Note  Patient: Rhonda Riley  Procedure(s) Performed: Procedure(s) (LRB) with comments: CESAREAN SECTION (N/A) - Primary cesarean section of baby boy  at 53  APGAR 8/9  Patient Location: PACU  Anesthesia Type:Epidural  Level of Consciousness: awake and sedated  Airway & Oxygen Therapy: Patient Spontanous Breathing  Post-op Assessment: Report given to PACU RN and Post -op Vital signs reviewed and stable  Post vital signs: Reviewed and stable  Complications: No apparent anesthesia complications

## 2012-06-09 NOTE — Anesthesia Postprocedure Evaluation (Signed)
  Anesthesia Post-op Note  Patient: Rhonda Riley  Procedure(s) Performed: Procedure(s) (LRB) with comments: CESAREAN SECTION (N/A) - Primary cesarean section of baby boy  at 38  APGAR 8/9  Patient Location: Mother/Baby  Anesthesia Type:Spinal  Level of Consciousness: awake  Airway and Oxygen Therapy: Patient Spontanous Breathing  Post-op Pain: none  Post-op Assessment: Patient's Cardiovascular Status Stable, Respiratory Function Stable, Patent Airway, No signs of Nausea or vomiting, Adequate PO intake, Pain level controlled, No headache, No backache, No residual numbness and No residual motor weakness  Post-op Vital Signs: Reviewed and stable  Complications: No apparent anesthesia complications

## 2012-06-09 NOTE — Progress Notes (Signed)
cx 8-9/C/+1  About same as 1.5 hours ago per nurse check FHT reactive UCs q2-6 min Epidural in C/W patient and husband-will begin pitocin

## 2012-06-09 NOTE — Anesthesia Procedure Notes (Signed)
Epidural Patient location during procedure: OB Start time: 06/09/2012 12:29 AM End time: 06/09/2012 12:33 AM  Staffing Anesthesiologist: Sandrea Hughs  Preanesthetic Checklist Completed: patient identified, site marked, surgical consent, pre-op evaluation, timeout performed, IV checked, risks and benefits discussed and monitors and equipment checked  Epidural Patient position: sitting Prep: site prepped and draped and DuraPrep Patient monitoring: continuous pulse ox and blood pressure Approach: midline Injection technique: LOR air  Needle:  Needle type: Tuohy  Needle gauge: 17 G Needle length: 9 cm and 9 Needle insertion depth: 6 cm Catheter type: closed end flexible Catheter size: 19 Gauge Catheter at skin depth: 11 cm Test dose: negative and Other  Assessment Sensory level: T8 Events: blood not aspirated, injection not painful, no injection resistance, negative IV test and no paresthesia  Additional Notes Reason for block:procedure for pain

## 2012-06-09 NOTE — Addendum Note (Signed)
Addendum  created 06/09/12 1355 by Suella Grove, CRNA   Modules edited:Notes Section

## 2012-06-09 NOTE — Anesthesia Postprocedure Evaluation (Signed)
  Anesthesia Post-op Note  Patient: Rhonda Riley  Procedure(s) Performed: Procedure(s) (LRB) with comments: CESAREAN SECTION (N/A) - Primary cesarean section of baby boy  at 71  APGAR 8/9  Patient Location: PACU  Anesthesia Type:Epidural  Level of Consciousness: awake, alert  and oriented  Airway and Oxygen Therapy: Patient Spontanous Breathing  Post-op Pain: none  Post-op Assessment: Post-op Vital signs reviewed, Patient's Cardiovascular Status Stable, Respiratory Function Stable, Patent Airway, No signs of Nausea or vomiting, Pain level controlled, No headache, No backache and No residual motor weakness  Post-op Vital Signs: Reviewed and stable  Complications: No apparent anesthesia complications

## 2012-06-09 NOTE — Progress Notes (Signed)
Pushing with good effort for 1.5 hours No change FHT reactive Rec: cesarean section          D/W patient and husband above and risks including infection, organ damage, bleeding/transfusion-HIV/Hep, DVT/PE, pneumonia.  All questions answered.

## 2012-06-09 NOTE — OR Nursing (Signed)
75 ml during fundal massage by DLWegner RN

## 2012-06-09 NOTE — Anesthesia Preprocedure Evaluation (Signed)
Anesthesia Evaluation  Patient identified by MRN, date of birth, ID band Patient awake    Reviewed: Allergy & Precautions, H&P , NPO status , Patient's Chart, lab work & pertinent test results  Airway Mallampati: I TM Distance: >3 FB Neck ROM: full    Dental No notable dental hx.    Pulmonary neg pulmonary ROS,    Pulmonary exam normal       Cardiovascular negative cardio ROS      Neuro/Psych negative psych ROS   GI/Hepatic negative GI ROS, Neg liver ROS,   Endo/Other  negative endocrine ROS  Renal/GU negative Renal ROS  negative genitourinary   Musculoskeletal negative musculoskeletal ROS (+)   Abdominal Normal abdominal exam  (+)   Peds negative pediatric ROS (+)  Hematology negative hematology ROS (+)   Anesthesia Other Findings   Reproductive/Obstetrics (+) Pregnancy                           Anesthesia Physical Anesthesia Plan  ASA: II  Anesthesia Plan: Epidural   Post-op Pain Management:    Induction:   Airway Management Planned:   Additional Equipment:   Intra-op Plan:   Post-operative Plan:   Informed Consent: I have reviewed the patients History and Physical, chart, labs and discussed the procedure including the risks, benefits and alternatives for the proposed anesthesia with the patient or authorized representative who has indicated his/her understanding and acceptance.     Plan Discussed with:   Anesthesia Plan Comments:         Anesthesia Quick Evaluation  

## 2012-06-10 ENCOUNTER — Encounter (HOSPITAL_COMMUNITY): Payer: Self-pay | Admitting: Obstetrics and Gynecology

## 2012-06-10 LAB — CBC
HCT: 22.8 % — ABNORMAL LOW (ref 36.0–46.0)
MCHC: 33.8 g/dL (ref 30.0–36.0)
Platelets: 164 10*3/uL (ref 150–400)
RDW: 13.5 % (ref 11.5–15.5)
WBC: 11.5 10*3/uL — ABNORMAL HIGH (ref 4.0–10.5)

## 2012-06-10 MED ORDER — FERROUS SULFATE 325 (65 FE) MG PO TABS
325.0000 mg | ORAL_TABLET | Freq: Two times a day (BID) | ORAL | Status: DC
  Administered 2012-06-10 – 2012-06-11 (×4): 325 mg via ORAL
  Filled 2012-06-10 (×4): qty 1

## 2012-06-10 NOTE — Progress Notes (Signed)
Subjective: Postpartum Day 1: Cesarean Delivery Patient reports tolerating PO, + flatus and no problems voiding.  Denies light-headedness/dizziness with standing.  Would like baby circumcised today.    Objective: Vital signs in last 24 hours: Temp:  [98.1 F (36.7 C)-98.6 F (37 C)] 98.5 F (36.9 C) (01/08 0605) Pulse Rate:  [93-118] 99  (01/08 0605) Resp:  [14-20] 20  (01/08 0605) BP: (91-111)/(55-78) 95/61 mmHg (01/08 0605) SpO2:  [96 %-98 %] 98 % (01/07 2230)  Physical Exam:  General: alert, cooperative and appears stated age Lochia: appropriate Uterine Fundus: firm Incision: no significant drainage, no significant erythema, honeycomb dressing in place with old drainage. DVT Evaluation: No evidence of DVT seen on physical exam. Negative Homan's sign. No cords or calf tenderness.   Basename 06/10/12 0525 06/08/12 1215  HGB 7.7* 12.4  HCT 22.8* 35.9*    Assessment/Plan: Status post Cesarean section. Doing well postoperatively.  Continue current care. ABL anemia-refuses transfusion at this point.  Will let me know if becomes symptomatic. Will do circ today.  Rhonda Riley 06/10/2012, 8:25 AM

## 2012-06-11 LAB — CBC
MCHC: 33.8 g/dL (ref 30.0–36.0)
Platelets: 156 10*3/uL (ref 150–400)
RDW: 13.2 % (ref 11.5–15.5)
WBC: 8.3 10*3/uL (ref 4.0–10.5)

## 2012-06-11 MED ORDER — IBUPROFEN 600 MG PO TABS: 600.0000 mg | ORAL_TABLET | Freq: Four times a day (QID) | ORAL | Status: AC

## 2012-06-11 MED ORDER — FERROUS SULFATE 325 (65 FE) MG PO TABS
325.0000 mg | ORAL_TABLET | Freq: Two times a day (BID) | ORAL | Status: AC
Start: 2012-06-11 — End: ?

## 2012-06-11 MED ORDER — OXYCODONE-ACETAMINOPHEN 5-325 MG PO TABS: 1.0000 | ORAL_TABLET | ORAL | Status: AC | PRN

## 2012-06-11 NOTE — Discharge Summary (Signed)
Obstetric Discharge Summary Reason for Admission: onset of labor Prenatal Procedures: ultrasound Intrapartum Procedures: cesarean: low cervical, transverse Postpartum Procedures: none Complications-Operative and Postpartum: none Hemoglobin  Date Value Range Status  06/10/2012 7.7* 12.0 - 15.0 g/dL Final     REPEATED TO VERIFY     DELTA CHECK NOTED     HCT  Date Value Range Status  06/10/2012 22.8* 36.0 - 46.0 % Final    Physical Exam:  General: alert and cooperative Lochia: appropriate Uterine Fundus: firm Incision: honeycomb dressing CDI DVT Evaluation: No evidence of DVT seen on physical exam. Negative Homan's sign. No cords or calf tenderness. No significant calf/ankle edema.  Discharge Diagnoses: Term Pregnancy-delivered  Discharge Information: Date: 06/11/2012 Activity: pelvic rest Diet: routine Medications: PNV, Ibuprofen, Percocet and fe Condition: stable Instructions: refer to practice specific booklet Discharge to: home, RTO in 1 week for HGB check and incision check   Newborn Data: Live born female  Birth Weight: 7 lb 2.3 oz (3240 g) APGAR: 8, 9  Home with mother.  Rhonda Riley G 06/11/2012, 8:25 AM

## 2012-06-11 NOTE — Progress Notes (Signed)
Subjective: Postpartum Day 2: Cesarean Delivery Patient reports tolerating PO, + flatus and no problems voiding.    Objective: Vital signs in last 24 hours: Temp:  [97.8 F (36.6 C)-98.6 F (37 C)] 97.8 F (36.6 C) (01/09 0610) Pulse Rate:  [76-104] 76  (01/09 0610) Resp:  [18] 18  (01/09 0610) BP: (94-117)/(58-78) 94/58 mmHg (01/09 0610)  Physical Exam:  General: alert and cooperative Lochia: appropriate Uterine Fundus: firm Incision: honeycomb dressing CDI DVT Evaluation: No evidence of DVT seen on physical exam. Negative Homan's sign. No cords or calf tenderness. No significant calf/ankle edema.   Basename 06/10/12 0525 06/08/12 1215  HGB 7.7* 12.4  HCT 22.8* 35.9*    Assessment/Plan: Status post Cesarean section. Doing well postoperatively.  Discharge home with standard precautions and return to clinic in 1 week  cbc prior to discharge.  Hermelinda Diegel G 06/11/2012, 8:10 AM

## 2012-06-12 NOTE — Progress Notes (Signed)
Subjective: Postpartum Day 3: Cesarean Delivery Patient reports tolerating PO, + flatus and no problems voiding. Patient had desired discharge yesterday, however baby needed double phototherapy and therefore discharge was cancelled   Objective: Vital signs in last 24 hours: Temp:  [97.7 F (36.5 C)-98.2 F (36.8 C)] 98.2 F (36.8 C) (01/10 0541) Pulse Rate:  [80-99] 98  (01/10 0541) Resp:  [18] 18  (01/10 0541) BP: (103-109)/(71-74) 107/71 mmHg (01/10 0541) SpO2:  [100 %] 100 % (01/09 2033)  Physical Exam:  General: alert and cooperative Lochia: appropriate Uterine Fundus: firm Incision: healing well, honeycomb dressing noted DVT Evaluation: No evidence of DVT seen on physical exam. No significant calf/ankle edema.   Basename 06/11/12 0835 06/10/12 0525  HGB 7.4* 7.7*  HCT 21.9* 22.8*    Assessment/Plan: Status post Cesarean section. Doing well postoperatively.  Discharge home with standard precautions and return to clinic in 1 week.  CURTIS,CAROL G 06/12/2012, 8:37 AM

## 2012-06-12 NOTE — Progress Notes (Signed)

## 2012-06-22 ENCOUNTER — Ambulatory Visit (HOSPITAL_COMMUNITY): Payer: BC Managed Care – PPO | Attending: Obstetrics and Gynecology

## 2014-04-04 ENCOUNTER — Encounter (HOSPITAL_COMMUNITY): Payer: Self-pay | Admitting: Obstetrics and Gynecology

## 2019-11-11 ENCOUNTER — Ambulatory Visit: Payer: Commercial Managed Care - PPO | Attending: Internal Medicine

## 2019-11-11 ENCOUNTER — Other Ambulatory Visit: Payer: Self-pay

## 2019-11-11 DIAGNOSIS — Z20822 Contact with and (suspected) exposure to covid-19: Secondary | ICD-10-CM | POA: Insufficient documentation

## 2019-11-12 ENCOUNTER — Telehealth: Payer: Self-pay | Admitting: *Deleted

## 2019-11-12 LAB — NOVEL CORONAVIRUS, NAA: SARS-CoV-2, NAA: NOT DETECTED

## 2019-11-12 LAB — SARS-COV-2, NAA 2 DAY TAT

## 2019-11-12 NOTE — Telephone Encounter (Signed)
Patient called for covid results,still pending. 

## 2022-02-28 ENCOUNTER — Ambulatory Visit (INDEPENDENT_AMBULATORY_CARE_PROVIDER_SITE_OTHER): Payer: 59

## 2022-02-28 ENCOUNTER — Encounter: Payer: Self-pay | Admitting: Emergency Medicine

## 2022-02-28 ENCOUNTER — Other Ambulatory Visit: Payer: Self-pay

## 2022-02-28 ENCOUNTER — Ambulatory Visit
Admission: EM | Admit: 2022-02-28 | Discharge: 2022-02-28 | Disposition: A | Discharge status: Home / Self Care | Payer: 59

## 2022-02-28 DIAGNOSIS — G8929 Other chronic pain: Secondary | ICD-10-CM

## 2022-02-28 DIAGNOSIS — M542 Cervicalgia: Secondary | ICD-10-CM

## 2022-02-28 NOTE — Discharge Instructions (Signed)
Continue anti-inflammatory pain medication, muscle relaxers, heat, stretches, massage.  Follow-up with orthopedics for a recheck.

## 2022-02-28 NOTE — ED Triage Notes (Signed)
Pt reports intermittent right sided neck pain for last several months. Pt reports increased frequency in pain last several weeks. Pt reports cold and hot compresses as well as muscle relaxer's with no change in symptoms.

## 2022-02-28 NOTE — ED Provider Notes (Signed)
[facility]    CSN: [id number] Arrival date & time: 02/28/22  0810      History   Chief Complaint Chief Complaint  Patient presents with   Neck Pain    HPI CHARISSE WENDELL is a 41 y.o. female.   Patient presenting today with several month history of midline posterior neck pain that sometimes radiates to bilateral sides.  She states this has been coming and going but the past week or so has been pretty constant and significant, sometimes limiting her range of motion and daily tasks.  Has been trying hot and cold compresses, muscle relaxers that her PCP prescribed her all with no improvement in symptoms.  Denies any known injury that she is aware of, radiation of pain down arms, numbness, tingling, weakness, headaches.    Past Medical History:  Diagnosis Date   Depression    GERD (gastroesophageal reflux disease)    Headache(784.0)     Patient Active Problem List   Diagnosis Date Noted   NAUSEA 11/22/2008   ABDOMINAL PAIN-LUQ 11/22/2008    Past Surgical History:  Procedure Laterality Date   CESAREAN SECTION  06/09/2012   Procedure: CESAREAN SECTION;  Surgeon: Leslie Andrea, MD;  Location: [facility];  Service: Obstetrics;  Laterality: N/A;  Primary cesarean section of baby boy  at 0524  APGAR 8/9    OB History     Gravida  1   Para  1   Term  1   Preterm  0   AB  0   Living  1      SAB  0   IAB  0   Ectopic  0   Multiple  0   Live Births  1            Home Medications    Prior to Admission medications   Medication Sig Start Date End Date Taking? Authorizing Provider  ALPRAZolam Prudy Feeler) 0.5 MG tablet Take 0.5 mg by mouth at bedtime as needed for anxiety.   Yes [provider]  buPROPion (WELLBUTRIN) 100 MG tablet Take 450 mg by mouth once.   Yes [provider]  zolpidem (AMBIEN) 5 MG tablet Take 5 mg by mouth as needed for sleep.   Yes [provider]  ferrous sulfate 325 (65 FE) MG tablet Take 1  tablet (325 mg total) by mouth 2 (two) times daily with a meal. 06/11/12   Julio Sicks, NP  ibuprofen (ADVIL,MOTRIN) 600 MG tablet Take 1 tablet (600 mg total) by mouth every 6 (six) hours. 06/11/12   Julio Sicks, NP  oxyCODONE-acetaminophen (PERCOCET/ROXICET) 5-325 MG per tablet Take 1-2 tablets by mouth every 4 (four) hours as needed (moderate - severe pain). 06/11/12   Julio Sicks, NP  Prenatal Vit-Fe Fumarate-FA (PRENATAL MULTIVITAMIN) TABS Take 1 tablet by mouth daily.    [provider]    Family History Family History  Problem Relation Age of Onset   Miscarriages / [place] Mother    Diabetes Maternal Grandmother     Social History Social History   Tobacco Use   Smoking status: Never  Substance Use Topics   Alcohol use: No   Drug use: No     Allergies   Patient has no known allergies.   Review of Systems Review of Systems Per HPI  Physical Exam Triage Vital Signs ED Triage Vitals  Enc Vitals Group     BP 02/28/22 0828 111/78     Pulse Rate 02/28/22 0828  72     Resp 02/28/22 0828 20     Temp 02/28/22 0828 98.2 F (36.8 C)     Temp Source 02/28/22 0828 Oral     SpO2 02/28/22 0828 98 %     Weight --      Height --      Head Circumference --      Peak Flow --      Pain Score 02/28/22 0829 6     Pain Loc --      Pain Edu? --      Excl. in [place]? --    No data found.  Updated Vital Signs BP 111/78 (BP Location: Right Arm)   Pulse 72   Temp 98.2 F (36.8 C) (Oral)   Resp 20   LMP 02/28/2022 (Approximate)   SpO2 98%   Breastfeeding No   Visual Acuity Right Eye Distance:   Left Eye Distance:   Bilateral Distance:    Right Eye Near:   Left Eye Near:    Bilateral Near:     Physical Exam Vitals and nursing note reviewed.  Constitutional:      Appearance: Normal appearance. She is not ill-appearing.  HENT:     Head: Atraumatic.  Eyes:     Extraocular Movements: Extraocular movements intact.     Conjunctiva/sclera: Conjunctivae  normal.  Cardiovascular:     Rate and Rhythm: Normal rate and regular rhythm.     Heart sounds: Normal heart sounds.  Pulmonary:     Effort: Pulmonary effort is normal.     Breath sounds: Normal breath sounds.  Musculoskeletal:        General: Tenderness present. No swelling or signs of injury. Normal range of motion.     Cervical back: Normal range of motion and neck supple.     Comments: Midline cervical spinal tenderness to palpation, range of motion fully intact, no bony deformities palpable  Skin:    General: Skin is warm and dry.  Neurological:     Mental Status: She is alert and oriented to person, place, and time.     Sensory: No sensory deficit.     Motor: No weakness.     Gait: Gait normal.  Psychiatric:        Mood and Affect: Mood normal.        Thought Content: Thought content normal.        Judgment: Judgment normal.      UC Treatments / Results  Labs (all labs ordered are listed, but only abnormal results are displayed) Labs Reviewed - No data to display  EKG   Radiology DG Cervical Spine Complete  Result Date: 02/28/2022 CLINICAL DATA:  Midline neck pain for several months. EXAM: CERVICAL SPINE - COMPLETE 4+ VIEW COMPARISON:  None Available. FINDINGS: The cervical spine is imaged through the T1 vertebral body in the lateral projection. Vertebral body heights are preserved. There is no evidence of acute injury. There is straightening of the normal cervical lordosis with slight reversal centered at C5, nonspecific. The disc heights are preserved. No significant degenerative changes are seen. The neural foramina appear patent. The prevertebral soft tissues are unremarkable. IMPRESSION: Nonspecific straightening and slight reversal of the normal cervical curvature. Otherwise unremarkable cervical radiographs with no significant degenerative changes and no acute finding. Electronically Signed   By: Valetta Mole M.D.   On: 02/28/2022 09:15    Procedures Procedures  (including critical care time)  Medications Ordered in UC Medications - No data to display  Initial Impression / Assessment and Plan / UC Course  I have reviewed the triage vital signs and the nursing notes.  Pertinent labs & imaging results that were available during my care of the patient were reviewed by me and considered in my medical decision making (see chart for details).     X-ray of the cervical spine showing some mild reversal of the normal cervical curvature, otherwise no other bony abnormalities.  Do suspect some ongoing inflammatory, muscular issues causing her pain.  Continue the muscle relaxers, anti-inflammatory pain medications and follow-up with orthopedics to see if they recommend physical therapy or other intervention given the chronic nature of the symptoms.  Final Clinical Impressions(s) / UC Diagnoses   Final diagnoses:  Chronic neck pain     Discharge Instructions      Continue anti-inflammatory pain medication, muscle relaxers, heat, stretches, massage.  Follow-up with orthopedics for a recheck.    ED Prescriptions   None    PDMP not reviewed this encounter.   Particia Nearing, [place] 02/28/22 1500

## 2022-07-28 ENCOUNTER — Ambulatory Visit
Admission: RE | Admit: 2022-07-28 | Discharge: 2022-07-28 | Disposition: A | Discharge status: Home / Self Care | Payer: No Typology Code available for payment source | Source: Ambulatory Visit | Attending: Family Medicine | Admitting: Family Medicine

## 2022-07-28 VITALS — BP 106/74 | HR 104 | Temp 98.4°F | Resp 20

## 2022-07-28 DIAGNOSIS — R109 Unspecified abdominal pain: Secondary | ICD-10-CM | POA: Diagnosis present

## 2022-07-28 DIAGNOSIS — R112 Nausea with vomiting, unspecified: Secondary | ICD-10-CM | POA: Diagnosis present

## 2022-07-28 DIAGNOSIS — N390 Urinary tract infection, site not specified: Secondary | ICD-10-CM

## 2022-07-28 LAB — POCT URINALYSIS DIP (MANUAL ENTRY)
Bilirubin, UA: NEGATIVE
Glucose, UA: NEGATIVE mg/dL
Nitrite, UA: POSITIVE — AB
Protein Ur, POC: 30 mg/dL — AB
Spec Grav, UA: 1.025 (ref 1.010–1.025)
Urobilinogen, UA: 0.2 E.U./dL
pH, UA: 5.5 (ref 5.0–8.0)

## 2022-07-28 LAB — POCT URINE PREGNANCY: Preg Test, Ur: NEGATIVE

## 2022-07-28 MED ORDER — ONDANSETRON 4 MG PO TBDP
4.0000 mg | ORAL_TABLET | Freq: Once | ORAL | Status: AC
  Administered 2022-07-28: 4 mg via ORAL

## 2022-07-28 MED ORDER — TRAMADOL HCL 50 MG PO TABS: 50.0000 mg | ORAL_TABLET | Freq: Every evening | ORAL | 0 refills | Status: AC | PRN

## 2022-07-28 MED ORDER — CEFTRIAXONE SODIUM 500 MG IJ SOLR
500.0000 mg | INTRAMUSCULAR | Status: DC
  Administered 2022-07-28: 500 mg via INTRAMUSCULAR

## 2022-07-28 MED ORDER — ONDANSETRON 4 MG PO TBDP: 4.0000 mg | ORAL_TABLET | Freq: Three times a day (TID) | ORAL | 0 refills | Status: AC | PRN

## 2022-07-28 MED ORDER — SULFAMETHOXAZOLE-TRIMETHOPRIM 800-160 MG PO TABS: 1.0000 | ORAL_TABLET | Freq: Two times a day (BID) | ORAL | 0 refills | Status: AC

## 2022-07-28 NOTE — ED Provider Notes (Signed)
[facility]    CSN: [id number] Arrival date & time: 07/28/22  1047      History   Chief Complaint Chief Complaint  Patient presents with   Back Pain    UTI - Entered by patient    HPI Rhonda Riley is a 42 y.o. female.   Presenting today with 6-day history of progressively worsening right lower abdominal pain, right flank pain, dysuria, mild hematuria, nausea, vomiting.  States she had a low-grade fever last night.  Denies chest pain, shortness of breath, bowel changes.  So far trying over-the-counter pain relievers with minimal relief.  LMP was 07/17/2022.    Past Medical History:  Diagnosis Date   Depression    GERD (gastroesophageal reflux disease)    Headache(784.0)     Patient Active Problem List   Diagnosis Date Noted   NAUSEA 11/22/2008   ABDOMINAL PAIN-LUQ 11/22/2008    Past Surgical History:  Procedure Laterality Date   CESAREAN SECTION  06/09/2012   Procedure: CESAREAN SECTION;  Surgeon: Allena Katz, MD;  Location: [facility];  Service: Obstetrics;  Laterality: N/A;  Primary cesarean section of baby boy  at 0524  APGAR 8/9    OB History     Gravida  1   Para  1   Term  1   Preterm  0   AB  0   Living  1      SAB  0   IAB  0   Ectopic  0   Multiple  0   Live Births  1            Home Medications    Prior to Admission medications   Medication Sig Start Date End Date Taking? Authorizing Provider  ondansetron (ZOFRAN-ODT) 4 MG disintegrating tablet Take 1 tablet (4 mg total) by mouth every 8 (eight) hours as needed for nausea or vomiting. 07/28/22  Yes Volney American, PA-C  sulfamethoxazole-trimethoprim (BACTRIM DS) 800-160 MG tablet Take 1 tablet by mouth 2 (two) times daily for 5 days. 07/28/22 08/02/22 Yes Volney American, PA-C  traMADol (ULTRAM) 50 MG tablet Take 1 tablet (50 mg total) by mouth at bedtime as needed. 07/28/22  Yes Volney American, PA-C  ALPRAZolam Duanne Moron) 0.5 MG tablet Take 0.5  mg by mouth at bedtime as needed for anxiety.    [provider]  buPROPion (WELLBUTRIN) 100 MG tablet Take 450 mg by mouth once.    [provider]  ferrous sulfate 325 (65 FE) MG tablet Take 1 tablet (325 mg total) by mouth 2 (two) times daily with a meal. 06/11/12   Juanda Chance, NP  ibuprofen (ADVIL,MOTRIN) 600 MG tablet Take 1 tablet (600 mg total) by mouth every 6 (six) hours. 06/11/12   Juanda Chance, NP  oxyCODONE-acetaminophen (PERCOCET/ROXICET) 5-325 MG per tablet Take 1-2 tablets by mouth every 4 (four) hours as needed (moderate - severe pain). 06/11/12   Juanda Chance, NP  Prenatal Vit-Fe Fumarate-FA (PRENATAL MULTIVITAMIN) TABS Take 1 tablet by mouth daily.    [provider]  zolpidem (AMBIEN) 5 MG tablet Take 5 mg by mouth as needed for sleep.    [provider]    Family History Family History  Problem Relation Age of Onset   Miscarriages / [place] Mother    Diabetes Maternal Grandmother     Social History Social History   Tobacco Use   Smoking status: Never  Substance Use Topics   Alcohol use: No  Drug use: No     Allergies   Patient has no known allergies.   Review of Systems Review of Systems PER HPI  Physical Exam Triage Vital Signs ED Triage Vitals  Enc Vitals Group     BP 07/28/22 1114 106/74     Pulse Rate 07/28/22 1114 (!) 104     Resp 07/28/22 1114 20     Temp 07/28/22 1114 98.4 F (36.9 C)     Temp Source 07/28/22 1114 Oral     SpO2 07/28/22 1114 98 %     Weight --      Height --      Head Circumference --      Peak Flow --      Pain Score 07/28/22 1119 8     Pain Loc --      Pain Edu? --      Excl. in [place]? --    No data found.  Updated Vital Signs BP 106/74 (BP Location: Right Arm)   Pulse (!) 104   Temp 98.4 F (36.9 C) (Oral)   Resp 20   LMP 07/17/2022   SpO2 98%   Visual Acuity Right Eye Distance:   Left Eye Distance:   Bilateral Distance:    Right Eye Near:   Left Eye Near:     Bilateral Near:     Physical Exam Vitals and nursing note reviewed.  Constitutional:      Comments: Appears in pain  HENT:     Head: Atraumatic.  Eyes:     Extraocular Movements: Extraocular movements intact.     Conjunctiva/sclera: Conjunctivae normal.  Cardiovascular:     Rate and Rhythm: Normal rate and regular rhythm.     Heart sounds: Normal heart sounds.  Pulmonary:     Effort: Pulmonary effort is normal.     Breath sounds: Normal breath sounds.  Abdominal:     General: Bowel sounds are normal. There is no distension.     Palpations: Abdomen is soft. There is no mass.     Tenderness: There is abdominal tenderness. There is right CVA tenderness. There is no left CVA tenderness or guarding.     Comments: Mild RLQ ttp without distention or guarding  Musculoskeletal:        General: Normal range of motion.     Cervical back: Normal range of motion and neck supple.  Skin:    General: Skin is warm and dry.  Neurological:     Mental Status: She is alert and oriented to person, place, and time.  Psychiatric:        Mood and Affect: Mood normal.        Thought Content: Thought content normal.        Judgment: Judgment normal.      UC Treatments / Results  Labs (all labs ordered are listed, but only abnormal results are displayed) Labs Reviewed  POCT URINALYSIS DIP (MANUAL ENTRY) - Abnormal; Notable for the following components:      Result Value   Clarity, UA cloudy (*)    Ketones, POC UA moderate (40) (*)    Blood, UA large (*)    Protein Ur, POC =30 (*)    Nitrite, UA Positive (*)    Leukocytes, UA Small (1+) (*)    All other components within normal limits  URINE CULTURE  POCT URINE PREGNANCY    EKG   Radiology No results found.  Procedures Procedures (including critical care time)  Medications Ordered in  UC Medications  cefTRIAXone (ROCEPHIN) injection 500 mg (500 mg Intramuscular Given 07/28/22 1154)  ondansetron (ZOFRAN-ODT) disintegrating tablet  4 mg (4 mg Oral Given 07/28/22 1133)    Initial Impression / Assessment and Plan / UC Course  I have reviewed the triage vital signs and the nursing notes.  Pertinent labs & imaging results that were available during my care of the patient were reviewed by me and considered in my medical decision making (see chart for details).     Minimally tachycardic in triage, otherwise vital signs reassuring.  Her urinalysis shows evidence of a urinary tract infection, concern for progression upward into the ureters or kidney given her CVA tenderness, fevers, nausea and vomiting at this time.  Zofran and Rocephin given in clinic, will treat outpatient with Zofran as needed, Bactrim, fluids.  Very small supply of tramadol given for pain particularly given inability to rule out a kidney stone also being present at this time.  Urine culture pending, adjust medication regimen if needed.  ED for significantly worsening symptoms.  Final Clinical Impressions(s) / UC Diagnoses   Final diagnoses:  Right flank pain  Acute lower UTI  Nausea and vomiting, unspecified vomiting type     Discharge Instructions      We have given a shot of antibiotics and some nausea medication today.  Your urinalysis does show a urinary tract infection, we have sent out for a urine culture to ensure that we have you on the appropriate antibiotics.  I have also sent more nausea medication for as needed use and some stronger pain medication to take at bedtime if needed.  Ibuprofen and Tylenol, fluids, close PCP follow-up as soon as possible.  Go to the ED for severely worsening symptoms at any time.    ED Prescriptions     Medication Sig Dispense Auth. Provider   sulfamethoxazole-trimethoprim (BACTRIM DS) 800-160 MG tablet Take 1 tablet by mouth 2 (two) times daily for 5 days. 10 tablet Volney American, PA-C   ondansetron (ZOFRAN-ODT) 4 MG disintegrating tablet Take 1 tablet (4 mg total) by mouth every 8 (eight) hours as  needed for nausea or vomiting. 20 tablet Volney American, [place]   traMADol (ULTRAM) 50 MG tablet Take 1 tablet (50 mg total) by mouth at bedtime as needed. 5 tablet Volney American, [place]      I have reviewed the PDMP during this encounter.   Volney American, [place] 07/28/22 1218

## 2022-07-28 NOTE — ED Triage Notes (Signed)
Pt reports she has been having lower right side back pain that radiates to her lower abdominal area, spotting painful urination, N/V x 6 days.

## 2022-07-28 NOTE — Discharge Instructions (Signed)
We have given a shot of antibiotics and some nausea medication today.  Your urinalysis does show a urinary tract infection, we have sent out for a urine culture to ensure that we have you on the appropriate antibiotics.  I have also sent more nausea medication for as needed use and some stronger pain medication to take at bedtime if needed.  Ibuprofen and Tylenol, fluids, close PCP follow-up as soon as possible.  Go to the ED for severely worsening symptoms at any time.

## 2022-07-30 ENCOUNTER — Telehealth: Payer: Self-pay

## 2022-07-31 LAB — URINE CULTURE

## 2022-08-01 LAB — URINE CULTURE: Culture: >=100000 — AB

## 2022-08-02 ENCOUNTER — Telehealth: Payer: Self-pay

## 2022-08-02 MED ORDER — NITROFURANTOIN MONOHYD MACRO 100 MG PO CAPS: 100.0000 mg | ORAL_CAPSULE | Freq: Two times a day (BID) | ORAL | 0 refills | Status: AC

## 2023-01-27 ENCOUNTER — Other Ambulatory Visit (HOSPITAL_COMMUNITY): Payer: Self-pay | Admitting: Family Medicine

## 2023-01-27 DIAGNOSIS — Z1231 Encounter for screening mammogram for malignant neoplasm of breast: Secondary | ICD-10-CM

## 2023-03-27 ENCOUNTER — Ambulatory Visit (HOSPITAL_COMMUNITY)
Admission: RE | Admit: 2023-03-27 | Discharge: 2023-03-27 | Disposition: A | Discharge status: Home / Self Care | Payer: No Typology Code available for payment source | Source: Ambulatory Visit | Attending: Family Medicine | Admitting: Family Medicine

## 2023-03-27 DIAGNOSIS — Z1231 Encounter for screening mammogram for malignant neoplasm of breast: Secondary | ICD-10-CM | POA: Insufficient documentation

## 2023-04-03 ENCOUNTER — Other Ambulatory Visit (HOSPITAL_COMMUNITY): Payer: Self-pay | Admitting: Family Medicine

## 2023-04-03 DIAGNOSIS — R928 Other abnormal and inconclusive findings on diagnostic imaging of breast: Secondary | ICD-10-CM

## 2023-05-06 ENCOUNTER — Ambulatory Visit (HOSPITAL_COMMUNITY)
Admission: RE | Admit: 2023-05-06 | Discharge: 2023-05-06 | Disposition: A | Discharge status: Home / Self Care | Payer: No Typology Code available for payment source | Source: Ambulatory Visit | Attending: Family Medicine | Admitting: Family Medicine

## 2023-05-06 ENCOUNTER — Encounter (HOSPITAL_COMMUNITY): Payer: Self-pay

## 2023-05-06 DIAGNOSIS — R928 Other abnormal and inconclusive findings on diagnostic imaging of breast: Secondary | ICD-10-CM
